# Patient Record
Sex: Female | Born: 2000 | Race: Black or African American | Hispanic: No | Marital: Single
Health system: Southern US, Community
[De-identification: ages and names within clinical notes are randomized; demographics above are authoritative.]

---
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
## 2020-12-06 ENCOUNTER — Emergency Department (HOSPITAL_COMMUNITY)
Admission: EM | Admit: 2020-12-06 | Discharge: 2020-12-07 | Disposition: A | Discharge status: Home / Self Care | Payer: Medicaid - Out of State | Attending: Emergency Medicine | Admitting: Emergency Medicine

## 2020-12-06 ENCOUNTER — Emergency Department (HOSPITAL_COMMUNITY): Payer: Medicaid - Out of State

## 2020-12-06 ENCOUNTER — Other Ambulatory Visit: Payer: Self-pay

## 2020-12-06 ENCOUNTER — Encounter (HOSPITAL_COMMUNITY): Payer: Self-pay | Admitting: Emergency Medicine

## 2020-12-06 DIAGNOSIS — R0602 Shortness of breath: Secondary | ICD-10-CM | POA: Insufficient documentation

## 2020-12-06 DIAGNOSIS — R0989 Other specified symptoms and signs involving the circulatory and respiratory systems: Secondary | ICD-10-CM | POA: Diagnosis not present

## 2020-12-06 DIAGNOSIS — R198 Other specified symptoms and signs involving the digestive system and abdomen: Secondary | ICD-10-CM

## 2020-12-06 DIAGNOSIS — Z7722 Contact with and (suspected) exposure to environmental tobacco smoke (acute) (chronic): Secondary | ICD-10-CM | POA: Insufficient documentation

## 2020-12-06 LAB — GROUP A STREP BY PCR: Group A Strep by PCR: NOT DETECTED

## 2020-12-06 NOTE — ED Triage Notes (Signed)
Emergency Medicine Provider Triage Evaluation Note  April Knapp , a 20 y.o. female  was evaluated in triage.  Pt complains of throat tightness that has been ongoing for 3 days. States sxs have been stable since onset and have not progressed. Denies throat pain. Has had this happen before after eating hot cheetos. She thinks she had to be on antibiotics in the past.   Review of Systems  Positive: Throat tightness and discomfort Negative: Difficulty swallowing  Physical Exam  BP 128/83 (BP Location: Left Arm)   Pulse 85   Resp 18   SpO2 98%  Gen:   Awake, no distress, pt texting on phone throughout evaluation HEENT:  Atraumatic, no angioedema, no tongue swelling, tonsils are large bilat but not asymmetric. Uvula midline, tolerating secretionss Resp:  Normal effort, no wheezing Cardiac:  Normal rate  Abd:   Nondistended, nontender  MSK:   Moves extremities without difficulty  Neuro:  Speech clear   Medical Decision Making  Medically screening exam initiated at 9:36 PM.  Appropriate orders placed.  April Knapp was informed that the remainder of the evaluation will be completed by another provider, this initial triage assessment does not replace that evaluation, and the importance of remaining in the ED until their evaluation is complete.  Clinical Impression   MSE was initiated and I personally evaluated the patient and placed orders (if any) at  9:36 PM on December 06, 2020.  The patient appears stable so that the remainder of the MSE may be completed by another provider.    Karrie Meres, [place] 12/06/20 2136

## 2020-12-06 NOTE — ED Triage Notes (Signed)
Patient with the feeling of her throat swelling and she states that she can't breath. She is having some shortness of breath.  She states that she had this about one year ago.  No tongue swelling, no airway swelling at this time.  Patient able to speak in full sentences.

## 2020-12-07 ENCOUNTER — Other Ambulatory Visit: Payer: Self-pay

## 2020-12-07 MED ORDER — ALUM & MAG HYDROXIDE-SIMETH 200-200-20 MG/5ML PO SUSP
30.0000 mL | Freq: Once | ORAL | Status: AC
Start: 2020-12-07 — End: 2020-12-07
  Administered 2020-12-07: 30 mL via ORAL
  Filled 2020-12-07: qty 30

## 2020-12-07 MED ORDER — LIDOCAINE VISCOUS HCL 2 % MT SOLN
15.0000 mL | Freq: Once | OROMUCOSAL | Status: AC
Start: 2020-12-07 — End: 2020-12-07
  Administered 2020-12-07: 15 mL via ORAL
  Filled 2020-12-07: qty 15

## 2020-12-07 NOTE — Discharge Instructions (Addendum)
Recommend Prilosec daily and Pepcid as needed to control symptoms of acid reflux. Follow up with a primary care provider of your choice if symptoms persist.

## 2020-12-07 NOTE — ED Provider Notes (Signed)
[facility] EMERGENCY DEPARTMENT Provider Note   CSN: [id number] Arrival date & time: 12/06/20  1933     History Chief Complaint  Patient presents with  . Shortness of Breath    April Knapp is a 20 y.o. female.  Patient to ED for evaluation of a fullness in her throat that started a couple of days ago after eating something spicy. No difficulty swallowing, no SOB, facial swelling or rash. She reports that when she belches the symptoms are a little better. No nausea, vomiting, abdominal pain.   The history is provided by the patient. No language interpreter was used.  Shortness of Breath Associated symptoms: no abdominal pain, no chest pain, no cough and no fever        History reviewed. No pertinent past medical history.  Patient Active Problem List   Diagnosis Date Noted  . Generalized abdominal pain 05/04/2017    History reviewed. No pertinent surgical history.   OB History   No obstetric history on file.     Family History  Problem Relation Age of Onset  . GER disease Paternal Grandmother     Social History   Tobacco Use  . Smoking status: Passive Smoke Exposure - Never Smoker  . Smokeless tobacco: Never Used  . Tobacco comment: outside, patient does not smoke  Substance Use Topics  . Alcohol use: No  . Drug use: No    Home Medications Prior to Admission medications   Medication Sig Start Date End Date Taking? Authorizing Provider  Aspirin-Salicylamide-Caffeine (BC HEADACHE) 325-95-16 MG TABS Take 1 packet by mouth once as needed (for pain).    [provider]  dicyclomine (BENTYL) 20 MG tablet Take 1 tablet (20 mg total) by mouth every 12 (twelve) hours as needed (for abdominal pain). Patient not taking: Reported on 05/14/2017 05/02/17   Antony Madura, PA-C  metroNIDAZOLE (FLAGYL) 500 MG tablet Take 1 tablet (500 mg total) by mouth 2 (two) times daily. 03/16/18   Garlon Hatchet, PA-C  naproxen (NAPROSYN) 500 MG tablet Take 1  tablet (500 mg total) by mouth every 12 (twelve) hours as needed for mild pain or moderate pain. 05/02/17   Antony Madura, PA-C  norethindrone-ethinyl estradiol (JUNEL 1/20) 1-20 MG-MCG tablet Take 1 tablet by mouth daily. 05/31/17   Hollice Gong, MD  omeprazole (PRILOSEC) 20 MG capsule Take 1 capsule (20 mg total) by mouth daily. Patient not taking: Reported on 05/31/2017 05/14/17   Laurena Spies, MD  senna-docusate (SENOKOT-S) 8.6-50 MG tablet Take 1 tablet by mouth at bedtime. Patient not taking: Reported on 05/14/2017 05/04/17   Laurena Spies, MD    Allergies    Patient has no known allergies.  Review of Systems   Review of Systems  Constitutional: Negative for appetite change and fever.  HENT: Negative for trouble swallowing and voice change.        See HPI.  Respiratory: Negative for cough, shortness of breath and stridor.   Cardiovascular: Negative for chest pain.  Gastrointestinal: Negative for abdominal pain.    Physical Exam Updated Vital Signs BP (!) 121/92 (BP Location: Right Arm)   Pulse 77   Temp 98.6 F (37 C) (Oral)   Resp 16   SpO2 99%   Physical Exam Constitutional:      General: She is not in acute distress.    Appearance: She is well-developed. She is obese.  HENT:     Head: Normocephalic.  Neck:     Thyroid: No thyromegaly.  Trachea: No tracheal deviation.  Cardiovascular:     Rate and Rhythm: Normal rate and regular rhythm.  Pulmonary:     Effort: Pulmonary effort is normal.     Breath sounds: Normal breath sounds. No wheezing, rhonchi or rales.  Abdominal:     General: Bowel sounds are normal.     Palpations: Abdomen is soft.     Tenderness: There is no abdominal tenderness. There is no guarding or rebound.  Musculoskeletal:        General: Normal range of motion.     Cervical back: Normal range of motion and neck supple.  Skin:    General: Skin is warm and dry.     Findings: No rash.  Neurological:     Mental Status: She is alert  and oriented to person, place, and time.     ED Results / Procedures / Treatments   Labs (all labs ordered are listed, but only abnormal results are displayed) Labs Reviewed  GROUP A STREP BY PCR    EKG None  Radiology DG Chest 2 View  Result Date: 12/06/2020 CLINICAL DATA:  Shortness of breath, throat swelling, difficulty breathing EXAM: CHEST - 2 VIEW COMPARISON:  None. FINDINGS: No consolidation, features of edema, pneumothorax, or effusion. Tracheal air column is patent. Pulmonary vascularity is normally distributed. The cardiomediastinal contours are unremarkable. No acute osseous or soft tissue abnormality. IMPRESSION: No acute cardiopulmonary abnormality. Electronically Signed   By: Kreg Shropshire M.D.   On: 12/06/2020 22:20    Procedures Procedures   Medications Ordered in ED Medications - No data to display  ED Course  I have reviewed the triage vital signs and the nursing notes.  Pertinent labs & imaging results that were available during my care of the patient were reviewed by me and considered in my medical decision making (see chart for details).    MDM Rules/Calculators/A&P                          Patient to ED with sense of fullness in her throat, constant since eating spicy food 3 days ago. No voice change, no symptoms of illness, no difficulty swallowing.   The patient is well appearing. VSS. Strep done through triage is negative. Suspect symptoms related to globus. Will provide GI cocktail and reassess for improvement. Plan for PPI and H2 blocker for home. PCP follow up.   Final Clinical Impression(s) / ED Diagnoses Final diagnoses:  None   1. Globus  Rx / DC Orders ED Discharge Orders    None       Danne Harbor 12/07/20 0208    Sabas Sous, MD 12/07/20 [phone]
